# Patient Record
Sex: Male | Born: 1960 | Race: White | Hispanic: No | Marital: Single
Health system: Southern US, Community
[De-identification: ages and names within clinical notes are randomized; demographics above are authoritative.]

---

## 2007-10-10 ENCOUNTER — Ambulatory Visit: Payer: Self-pay | Admitting: General Surgery

## 2009-09-16 ENCOUNTER — Ambulatory Visit: Payer: Self-pay | Admitting: Internal Medicine

## 2009-12-10 ENCOUNTER — Ambulatory Visit: Payer: Self-pay | Admitting: Unknown Physician Specialty

## 2011-06-06 ENCOUNTER — Ambulatory Visit: Payer: Self-pay | Admitting: Gastroenterology

## 2011-06-07 LAB — PATHOLOGY REPORT

## 2012-09-27 ENCOUNTER — Inpatient Hospital Stay: Payer: Self-pay | Admitting: Surgery

## 2012-09-27 LAB — CBC
HCT: 39.6 % — ABNORMAL LOW (ref 40.0–52.0)
HGB: 13.7 g/dL (ref 13.0–18.0)
MCH: 33.4 pg (ref 26.0–34.0)
MCHC: 34.5 g/dL (ref 32.0–36.0)
Platelet: 214 10*3/uL (ref 150–440)
WBC: 17.9 10*3/uL — ABNORMAL HIGH (ref 3.8–10.6)

## 2012-09-27 LAB — BASIC METABOLIC PANEL
BUN: 19 mg/dL — ABNORMAL HIGH (ref 7–18)
Calcium, Total: 8.7 mg/dL (ref 8.5–10.1)
Chloride: 101 mmol/L (ref 98–107)
Co2: 26 mmol/L (ref 21–32)
Creatinine: 1.16 mg/dL (ref 0.60–1.30)
EGFR (African American): >60
EGFR (Non-African Amer.): >60
Potassium: 4 mmol/L (ref 3.5–5.1)
Sodium: 134 mmol/L — ABNORMAL LOW (ref 136–145)

## 2012-09-28 LAB — CBC WITH DIFFERENTIAL/PLATELET
Basophil %: 0.4 %
Eosinophil #: 0.6 10*3/uL (ref 0.0–0.7)
Eosinophil %: 3.4 %
HCT: 36 % — ABNORMAL LOW (ref 40.0–52.0)
HGB: 12.3 g/dL — ABNORMAL LOW (ref 13.0–18.0)
Lymphocyte %: 11.3 %
MCHC: 34.1 g/dL (ref 32.0–36.0)
MCV: 98 fL (ref 80–100)
Monocyte #: 1.1 x10 3/mm — ABNORMAL HIGH (ref 0.2–1.0)
Monocyte %: 6.2 %
RDW: 14.4 % (ref 11.5–14.5)
WBC: 17.1 10*3/uL — ABNORMAL HIGH (ref 3.8–10.6)

## 2012-09-28 LAB — COMPREHENSIVE METABOLIC PANEL
Albumin: 3.1 g/dL — ABNORMAL LOW (ref 3.4–5.0)
Alkaline Phosphatase: 99 U/L (ref 50–136)
Calcium, Total: 8.7 mg/dL (ref 8.5–10.1)
Co2: 28 mmol/L (ref 21–32)
Creatinine: 1.09 mg/dL (ref 0.60–1.30)
EGFR (African American): >60
EGFR (Non-African Amer.): >60
Glucose: 101 mg/dL — ABNORMAL HIGH (ref 65–99)
Osmolality: 276 (ref 275–301)
SGOT(AST): 22 U/L (ref 15–37)
Sodium: 137 mmol/L (ref 136–145)

## 2012-09-29 LAB — CBC WITH DIFFERENTIAL/PLATELET
Eosinophil %: 5.8 %
Lymphocyte %: 12.7 %
MCV: 99 fL (ref 80–100)
Monocyte #: 1 x10 3/mm (ref 0.2–1.0)
Monocyte %: 7.4 %
Neutrophil %: 73.6 %
RDW: 14.6 % — ABNORMAL HIGH (ref 11.5–14.5)
WBC: 13.5 10*3/uL — ABNORMAL HIGH (ref 3.8–10.6)

## 2012-09-30 LAB — CBC WITH DIFFERENTIAL/PLATELET
Basophil #: 0.1 10*3/uL (ref 0.0–0.1)
Basophil %: 0.4 %
HCT: 33.9 % — ABNORMAL LOW (ref 40.0–52.0)
HGB: 11.8 g/dL — ABNORMAL LOW (ref 13.0–18.0)
Lymphocyte #: 1.9 10*3/uL (ref 1.0–3.6)
Lymphocyte %: 13.5 %
MCH: 33.9 pg (ref 26.0–34.0)
MCV: 97 fL (ref 80–100)
Monocyte #: 1.3 x10 3/mm — ABNORMAL HIGH (ref 0.2–1.0)
Monocyte %: 9.3 %
Neutrophil #: 10.2 10*3/uL — ABNORMAL HIGH (ref 1.4–6.5)
Neutrophil %: 73.5 %
Platelet: 211 10*3/uL (ref 150–440)
WBC: 13.8 10*3/uL — ABNORMAL HIGH (ref 3.8–10.6)

## 2012-09-30 LAB — PROTIME-INR
INR: 1.7
Prothrombin Time: 20.7 secs — ABNORMAL HIGH (ref 11.5–14.7)

## 2012-09-30 LAB — BASIC METABOLIC PANEL
BUN: 14 mg/dL (ref 7–18)
Glucose: 97 mg/dL (ref 65–99)
Potassium: 4.2 mmol/L (ref 3.5–5.1)

## 2012-10-03 LAB — CBC WITH DIFFERENTIAL/PLATELET
Basophil #: 0 10*3/uL (ref 0.0–0.1)
Eosinophil #: 0.8 10*3/uL — ABNORMAL HIGH (ref 0.0–0.7)
Eosinophil %: 9.5 %
HCT: 31.4 % — ABNORMAL LOW (ref 40.0–52.0)
HGB: 10.6 g/dL — ABNORMAL LOW (ref 13.0–18.0)
Lymphocyte #: 1.9 10*3/uL (ref 1.0–3.6)
Lymphocyte %: 23.2 %
MCHC: 33.7 g/dL (ref 32.0–36.0)
Monocyte #: 1 x10 3/mm (ref 0.2–1.0)
Monocyte %: 12.3 %
Neutrophil #: 4.5 10*3/uL (ref 1.4–6.5)
Neutrophil %: 54.4 %
WBC: 8.3 10*3/uL (ref 3.8–10.6)

## 2012-10-09 ENCOUNTER — Ambulatory Visit: Payer: Self-pay | Admitting: Surgery

## 2012-10-28 ENCOUNTER — Ambulatory Visit: Payer: Self-pay | Admitting: Surgery

## 2012-11-20 ENCOUNTER — Inpatient Hospital Stay: Payer: Self-pay | Admitting: Internal Medicine

## 2012-11-20 LAB — BASIC METABOLIC PANEL
Calcium, Total: 8.9 mg/dL (ref 8.5–10.1)
Chloride: 105 mmol/L (ref 98–107)
Co2: 27 mmol/L (ref 21–32)
Creatinine: 0.71 mg/dL (ref 0.60–1.30)
EGFR (African American): >60
Glucose: 102 mg/dL — ABNORMAL HIGH (ref 65–99)
Osmolality: 270 (ref 275–301)
Potassium: 3.9 mmol/L (ref 3.5–5.1)
Sodium: 136 mmol/L (ref 136–145)

## 2012-11-20 LAB — CBC
HGB: 15.5 g/dL (ref 13.0–18.0)
MCH: 32 pg (ref 26.0–34.0)
RBC: 4.84 10*6/uL (ref 4.40–5.90)

## 2012-11-20 LAB — PROTIME-INR
INR: 1.1
Prothrombin Time: 14.4 secs (ref 11.5–14.7)

## 2012-11-20 LAB — TROPONIN I: Troponin-I: 0.07 ng/mL — ABNORMAL HIGH

## 2012-11-21 LAB — CBC WITH DIFFERENTIAL/PLATELET
Basophil #: 0 10*3/uL (ref 0.0–0.1)
Basophil %: 0.2 %
Eosinophil #: 0.4 10*3/uL (ref 0.0–0.7)
Eosinophil %: 3.2 %
HCT: 41.5 % (ref 40.0–52.0)
HGB: 14.3 g/dL (ref 13.0–18.0)
Lymphocyte #: 1.3 10*3/uL (ref 1.0–3.6)
Lymphocyte %: 11.8 %
MCHC: 34.4 g/dL (ref 32.0–36.0)
Monocyte %: 8.3 %
RBC: 4.29 10*6/uL — ABNORMAL LOW (ref 4.40–5.90)
RDW: 14.5 % (ref 11.5–14.5)

## 2012-11-21 LAB — BASIC METABOLIC PANEL
Calcium, Total: 8.4 mg/dL — ABNORMAL LOW (ref 8.5–10.1)
Chloride: 104 mmol/L (ref 98–107)
Co2: 30 mmol/L (ref 21–32)
EGFR (African American): >60
EGFR (Non-African Amer.): >60
Osmolality: 273 (ref 275–301)
Potassium: 4.2 mmol/L (ref 3.5–5.1)
Sodium: 137 mmol/L (ref 136–145)

## 2012-11-21 LAB — APTT
Activated PTT: 66.4 secs — ABNORMAL HIGH (ref 23.6–35.9)
Activated PTT: 65.5 secs — ABNORMAL HIGH (ref 23.6–35.9)
Activated PTT: 82.6 secs — ABNORMAL HIGH (ref 23.6–35.9)

## 2012-11-22 LAB — BASIC METABOLIC PANEL
Chloride: 104 mmol/L (ref 98–107)
Co2: 30 mmol/L (ref 21–32)
EGFR (Non-African Amer.): >60
Osmolality: 272 (ref 275–301)
Potassium: 4 mmol/L (ref 3.5–5.1)
Sodium: 136 mmol/L (ref 136–145)

## 2012-11-22 LAB — CBC WITH DIFFERENTIAL/PLATELET
Basophil #: 0.1 10*3/uL (ref 0.0–0.1)
Basophil %: 0.6 %
HCT: 37.7 % — ABNORMAL LOW (ref 40.0–52.0)
MCH: 32.2 pg (ref 26.0–34.0)
MCV: 96 fL (ref 80–100)
Monocyte #: 1 x10 3/mm (ref 0.2–1.0)
Monocyte %: 8.8 %
Platelet: 192 10*3/uL (ref 150–440)
RBC: 3.91 10*6/uL — ABNORMAL LOW (ref 4.40–5.90)
RDW: 14 % (ref 11.5–14.5)
WBC: 11.3 10*3/uL — ABNORMAL HIGH (ref 3.8–10.6)

## 2012-11-23 LAB — APTT: Activated PTT: 107.1 secs — ABNORMAL HIGH (ref 23.6–35.9)

## 2012-11-23 LAB — BASIC METABOLIC PANEL
BUN: 10 mg/dL (ref 7–18)
Chloride: 106 mmol/L (ref 98–107)
Co2: 30 mmol/L (ref 21–32)
Creatinine: 0.73 mg/dL (ref 0.60–1.30)
EGFR (African American): >60
EGFR (Non-African Amer.): >60
Osmolality: 276 (ref 275–301)
Potassium: 4.2 mmol/L (ref 3.5–5.1)

## 2012-11-23 LAB — CBC WITH DIFFERENTIAL/PLATELET
Basophil #: 0.1 10*3/uL (ref 0.0–0.1)
Basophil %: 0.5 %
Eosinophil #: 0.3 10*3/uL (ref 0.0–0.7)
HCT: 39.8 % — ABNORMAL LOW (ref 40.0–52.0)
HGB: 13.3 g/dL (ref 13.0–18.0)
Lymphocyte %: 12.2 %
MCHC: 33.3 g/dL (ref 32.0–36.0)
Monocyte %: 9.1 %
Neutrophil %: 75.8 %
WBC: 11.2 10*3/uL — ABNORMAL HIGH (ref 3.8–10.6)

## 2012-11-23 LAB — PROTIME-INR: INR: 1.6

## 2012-11-24 LAB — CBC WITH DIFFERENTIAL/PLATELET
Basophil #: 0.1 10*3/uL (ref 0.0–0.1)
Basophil %: 0.8 %
Eosinophil #: 0.4 10*3/uL (ref 0.0–0.7)
HCT: 38.8 % — ABNORMAL LOW (ref 40.0–52.0)
HGB: 12.7 g/dL — ABNORMAL LOW (ref 13.0–18.0)
Lymphocyte #: 2.1 10*3/uL (ref 1.0–3.6)
Lymphocyte %: 23.4 %
MCHC: 32.7 g/dL (ref 32.0–36.0)
MCV: 97 fL (ref 80–100)
Monocyte #: 0.9 x10 3/mm (ref 0.2–1.0)
Monocyte %: 9.9 %
Neutrophil %: 61.6 %
Platelet: 210 10*3/uL (ref 150–440)
RBC: 4.01 10*6/uL — ABNORMAL LOW (ref 4.40–5.90)
RDW: 14.4 % (ref 11.5–14.5)
WBC: 9 10*3/uL (ref 3.8–10.6)

## 2012-11-24 LAB — BASIC METABOLIC PANEL
Anion Gap: 3 — ABNORMAL LOW (ref 7–16)
BUN: 11 mg/dL (ref 7–18)
Chloride: 105 mmol/L (ref 98–107)
Co2: 30 mmol/L (ref 21–32)
EGFR (African American): >60
EGFR (Non-African Amer.): >60
Glucose: 90 mg/dL (ref 65–99)
Potassium: 4.2 mmol/L (ref 3.5–5.1)
Sodium: 138 mmol/L (ref 136–145)

## 2012-11-24 LAB — APTT
Activated PTT: >160 secs (ref 23.6–35.9)
Activated PTT: 87.5 secs — ABNORMAL HIGH (ref 23.6–35.9)

## 2012-11-24 LAB — PROTIME-INR: INR: 1.7

## 2013-02-17 ENCOUNTER — Ambulatory Visit: Payer: Self-pay | Admitting: Vascular Surgery

## 2013-02-17 LAB — PROTIME-INR
INR: 2.3
Prothrombin Time: 24.4 secs — ABNORMAL HIGH (ref 11.5–14.7)

## 2013-06-01 IMAGING — CR DG CHEST 2V
1 series · 2 of 2 positions shown · non-contrast
Comparison: none

REASON FOR EXAM: follow up
COMMENTS:

[Series 1: pa · 0.17mm/px · 2 of 2 slices shown]
[im 1/2]
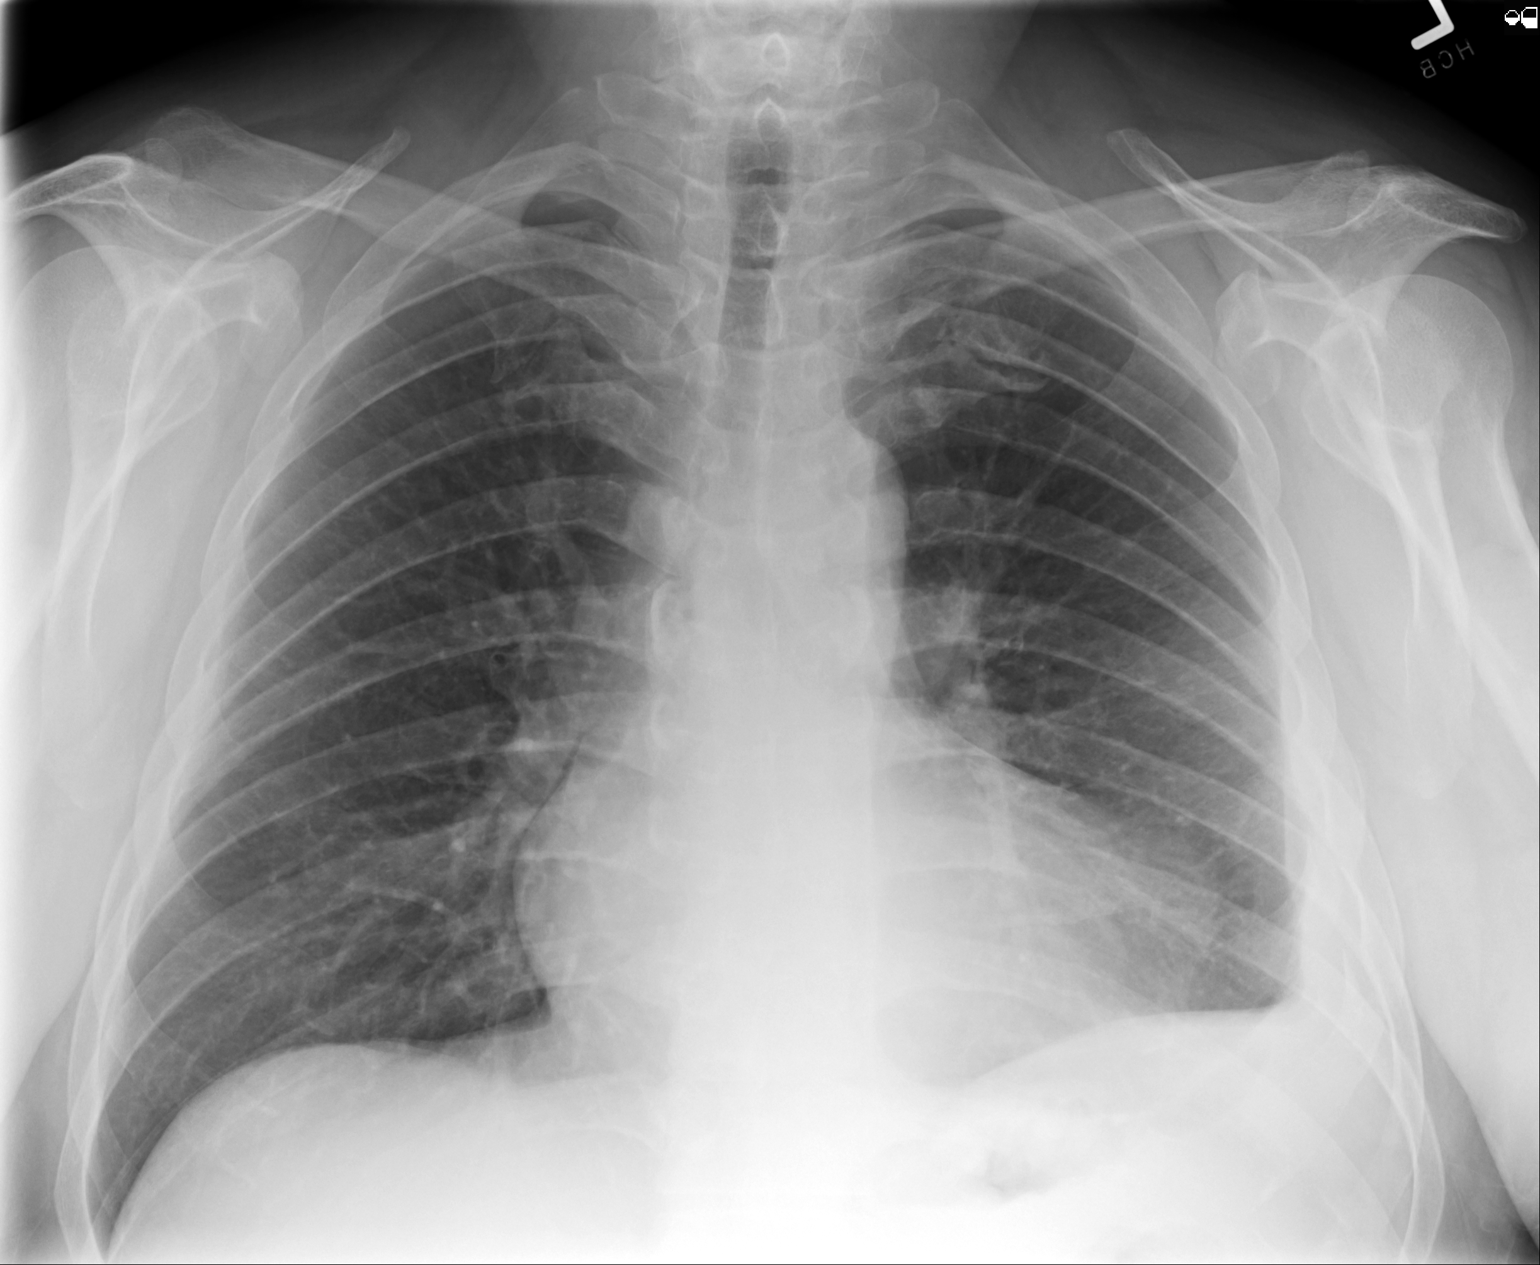
[im 2/2]
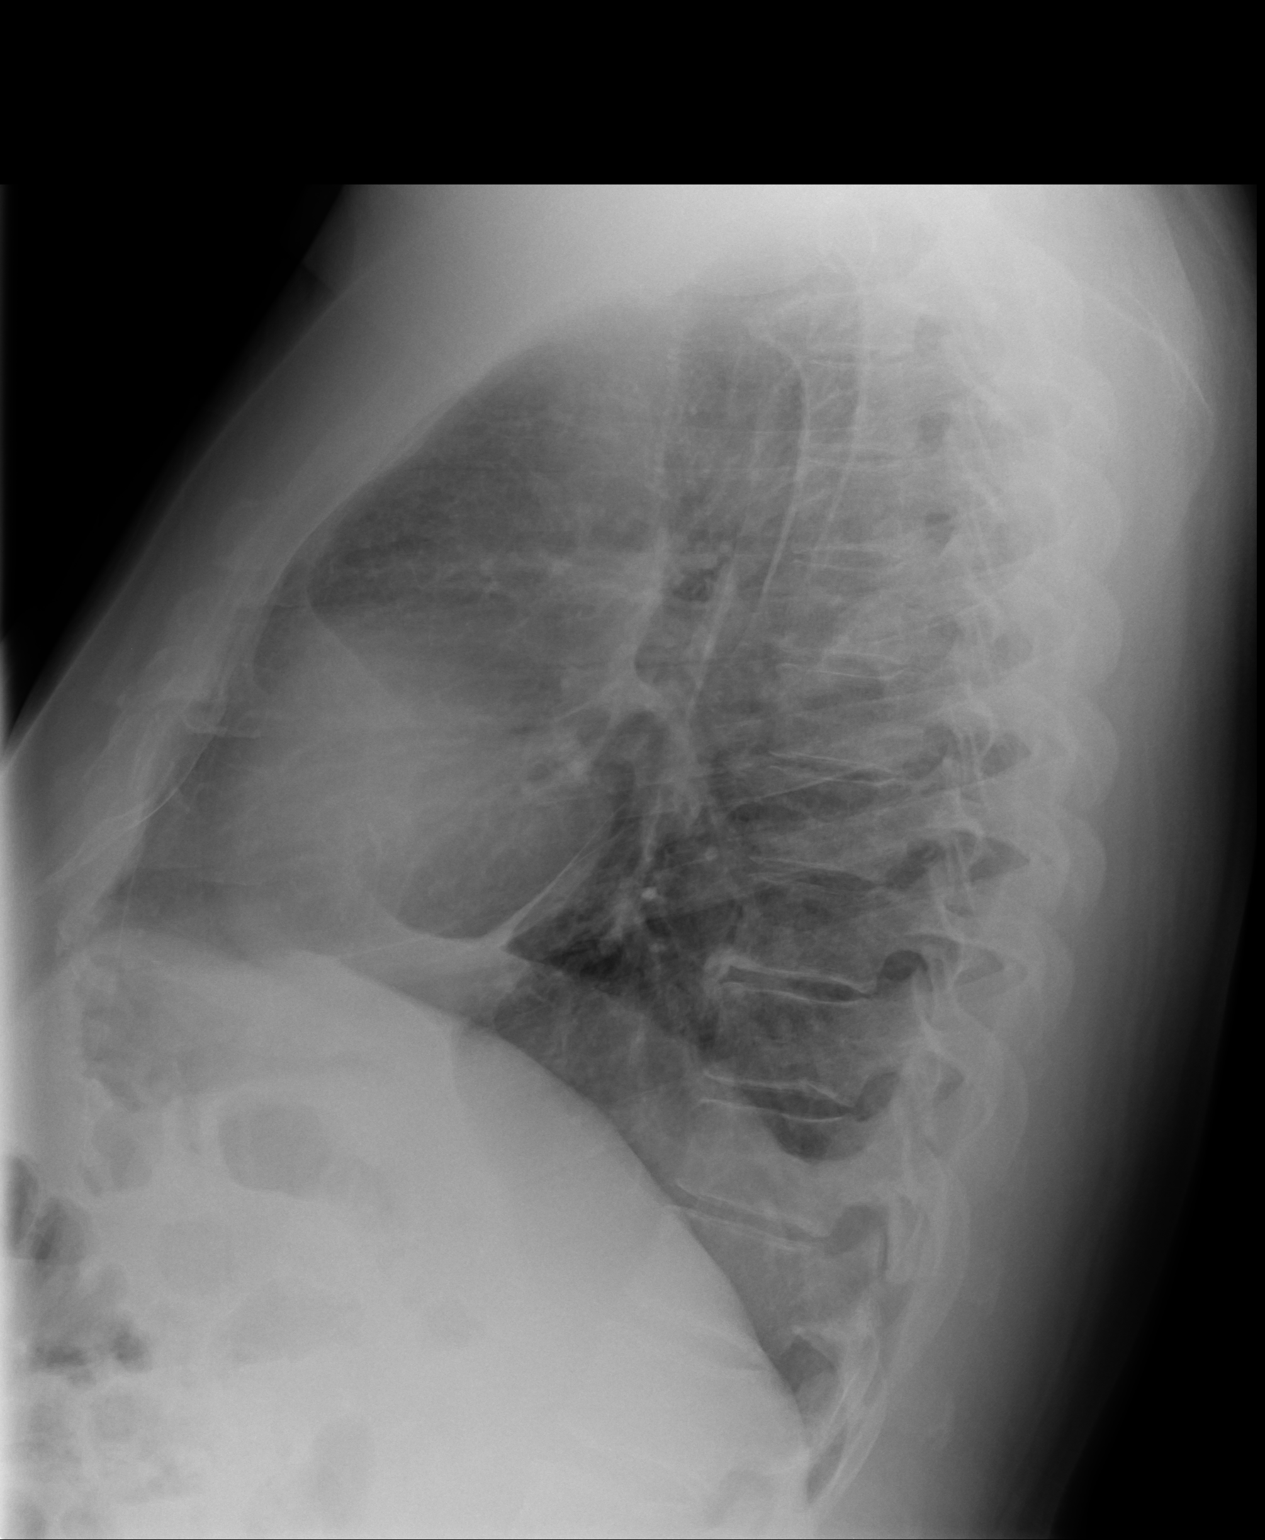

[2 of 2 positions shown; findings below may reference images not displayed]

PROCEDURE:     MDR - MDR CHEST PA(OR AP) AND LATERAL  - October 28, 2012  [DATE]

RESULT:     Comparison is made to the previous exam of 09 October, 2012.

There is persistent left pleural effusion with pleural thickening or
loculated fluid laterally in the inferior half of the left hemithorax. The
right lung is fully inflated and clear. The heart appears normal in size.
There is no pneumothorax. The patient has known left rib fractures.
IMPRESSION: Residual pleural thickening or loculated fluid along the
left chest wall at the site of fractures with a small amount of left pleural
fluid. No definite pneumothorax evident.

[REDACTED]

## 2013-09-21 IMAGING — XA IR VASCULAR PROCEDURE
2 series · 3 of 3 positions shown · IV contrast (IODINE)
Comparison: none

[Series 1: ivc · 2 of 2 slices shown]
[im 1/2]
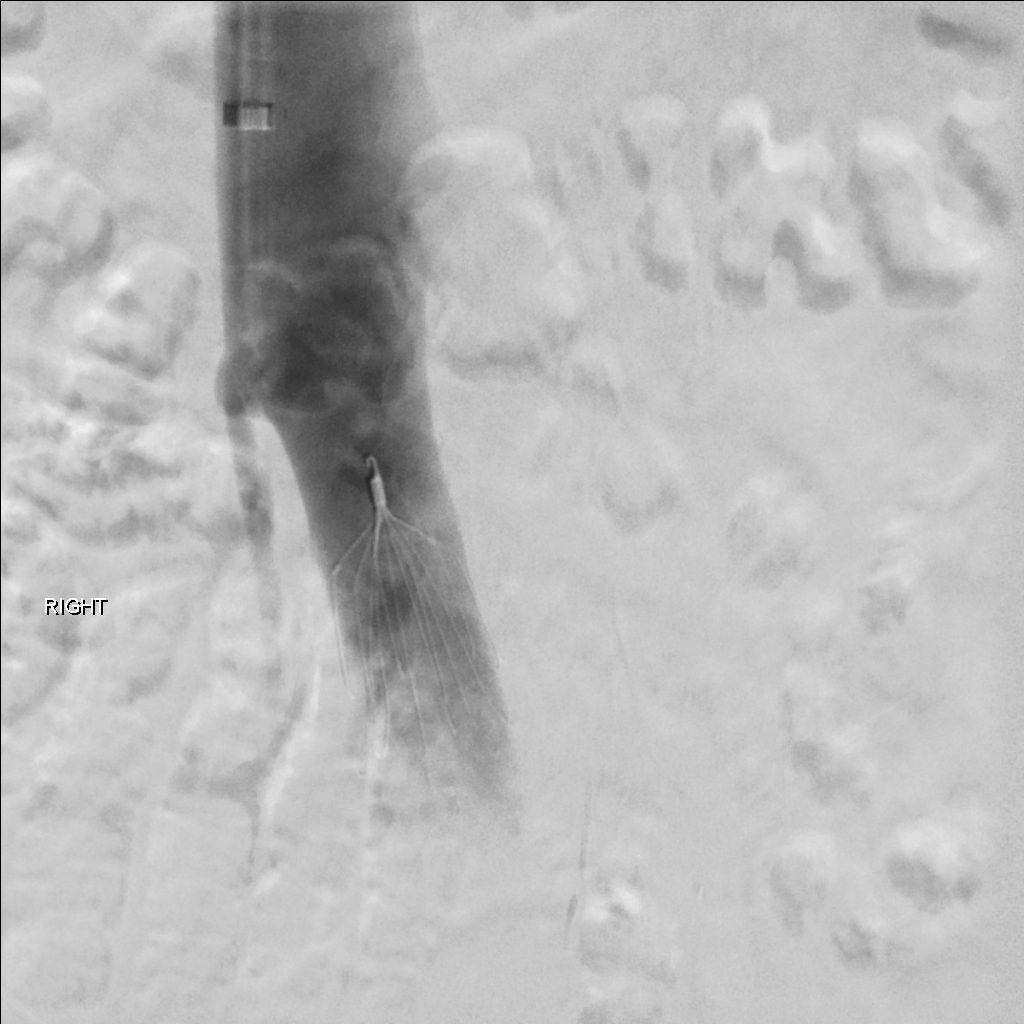
[im 2/2]
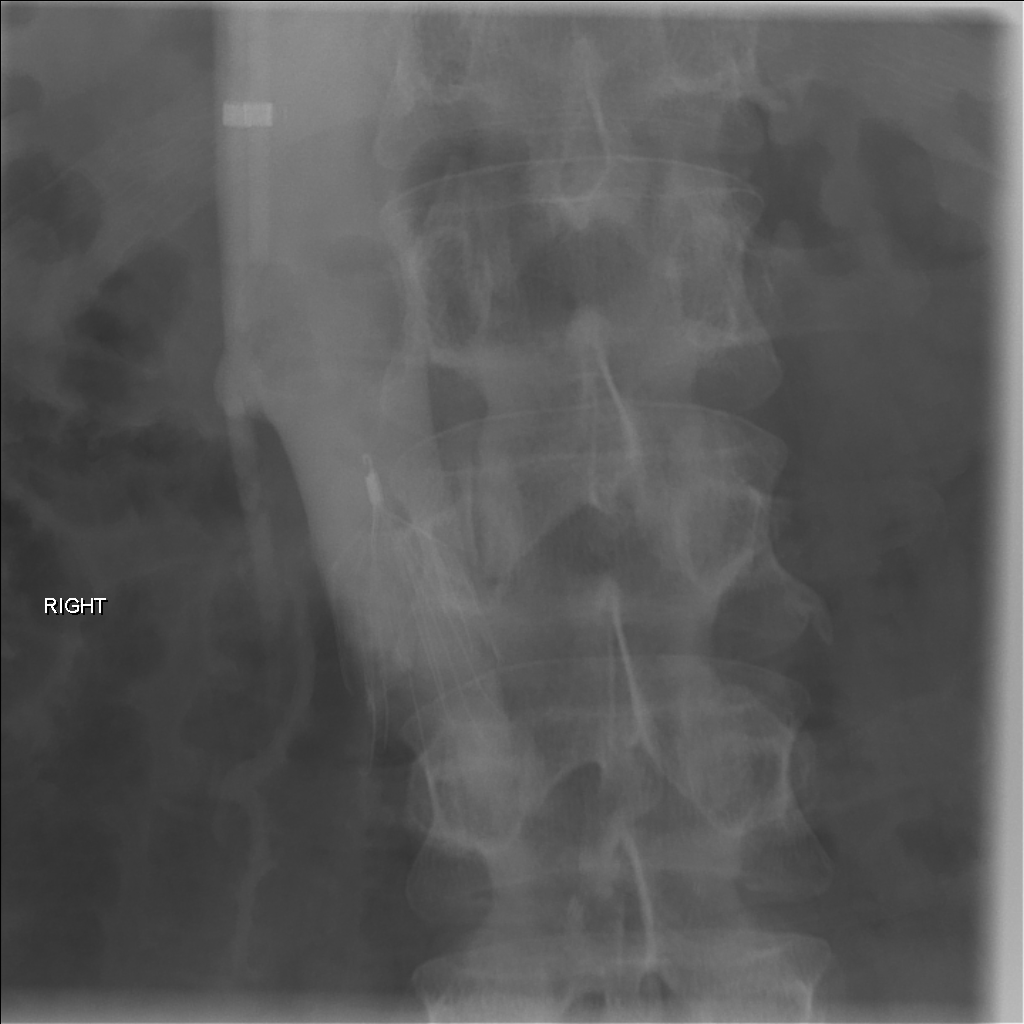

[Series 2: single · 1 of 1 slices shown]
[im 1/1]
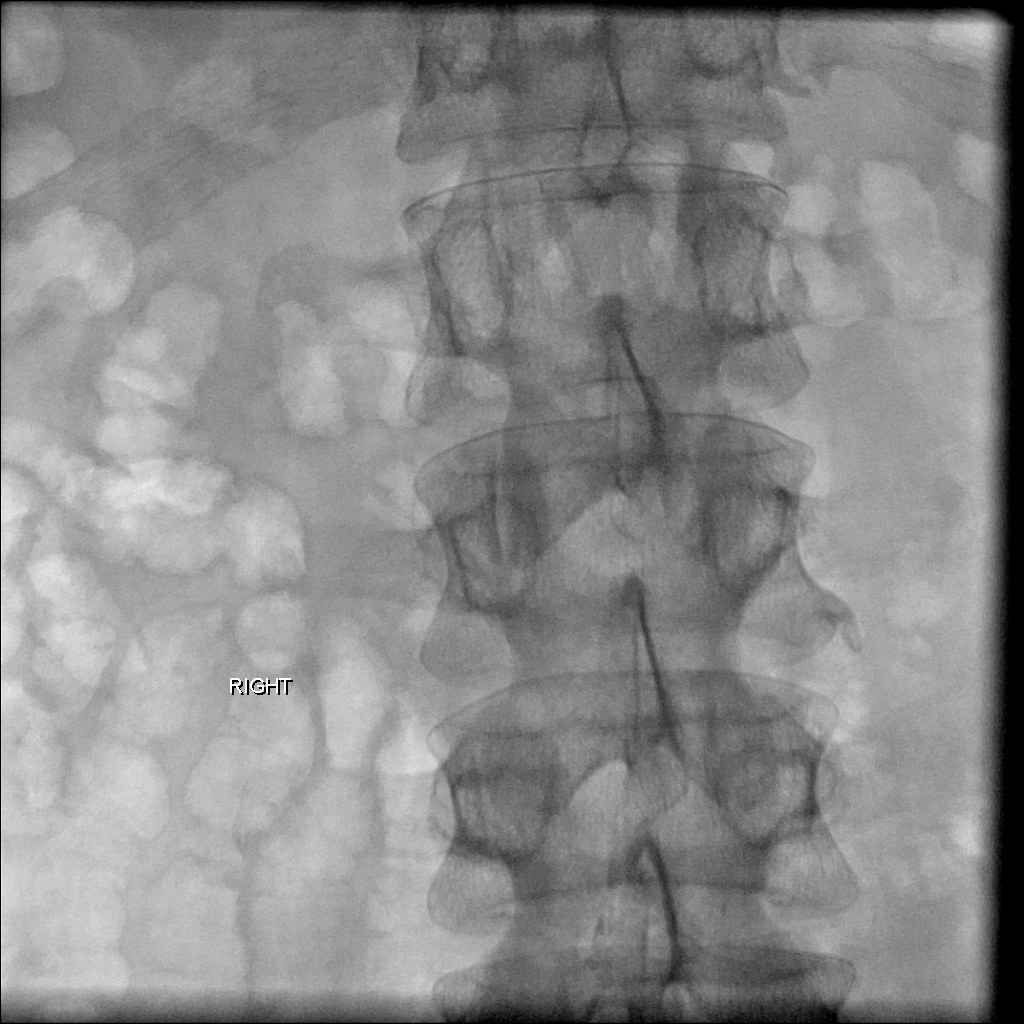

[3 of 3 positions shown; findings below may reference images not displayed]

IMAGES IMPORTED FROM THE [employer] WORKFLOW SYSTEM
NO DICTATION FOR STUDY

## 2015-01-01 NOTE — Discharge Summary (Signed)
PATIENT NAME:  Edward Casey, Edward Casey MR#:  [id number] DATE OF BIRTH:  10-25-1960  DATE OF ADMISSION:  09/27/2012 DATE OF DISCHARGE:  10/04/2012  DISCHARGE DIAGNOSES: 1.  Left traumatic hemopneumothorax.  2.  History of hernia repair.   DISCHARGE MEDICATIONS: Zyrtec 10 mg p.o. daily; Celexa 20 mg p.o. daily; Acetaminophen oxycodone 325/5 1 to 2 tabs p.o. q.4 hours p.r.n. pain.   INDICATION FOR ADMISSION:  Edward Casey is a pleasant 54 year old male who had a fall and had a traumatic hemopneumothorax and placed a chest tube on date of admission.   HOSPITAL COURSE: Edward Casey had a tube thoracostomy placed.  It drained about 1.5 liter of fluids. He had a large air leak initially.  During his hospital course, his tube was advanced to a water seal and later discontinued. At the time of discharge, his lung was expanded and there was minimal residual fluid.  He was having a regular diet and pain was controlled with p.o. pain medications.   DISCHARGE INSTRUCTIONS: Edward Casey is to follow up in approximately 1 to 2 weeks with myself. He is to call or return to the ED if he has increased pain, nausea, vomiting, redness, or drainage from incision or shortness of breath.    ____________________________ Edward Raiderhristopher A. Yarelie Hams, MD cal:eg D: 10/12/2012 17:49:00 ET T: 10/13/2012 20:38:26 ET JOB#: [id number]  cc: Cristal Deerhristopher A. Izreal Kock, MD, <Dictator> Jarvis NewcomerHRISTOPHER A Jianni Shelden MD ELECTRONICALLY SIGNED 10/20/2012 9:52

## 2015-01-01 NOTE — H&P (Signed)
PATIENT NAME:  Edward Casey, Edward Casey MR#:  [id number] DATE OF BIRTH:  1961/02/25  DATE OF ADMISSION:  09/27/2012  REASON FOR ADMISSION:  Chest wall pain and shortness of breath after falling on computer desk 4 days ago.  He was noted to have a large hemothorax on the left side and fractured ribs.   HISTORY OF PRESENT ILLNESS:  The patient is a pleasant 54 year old male who fell and hit his left chest on his desk approximately 4 days ago.  He was seen in urgent care and was sent home with pain control.  He was noted to have persistent pain despite PO narcotics and therefore presented to ED today.  A CT was performed which showed a large hemothorax on his left side and he was having difficulty breathing, otherwise was having significant pain which was not controlled.  No fevers, chills, night sweats, shortness of breath, cough, abdominal pain, nausea, vomiting, diarrhea, constipation.   PAST MEDICAL HISTORY:  History of hernia repair.   MEDICATIONS:  Zyrtec 10 mg daily, Celexa 20 mg p.o. daily.   ALLERGIES:  No known drug allergies.   SOCIAL HISTORY:  [place] in [place].  There is no family here with him.  Current every day smoker and social drinker.  Does not have withdrawals.  Says he smokes about a pack a day.   REVIEW OF SYSTEMS:  A 12-point review of systems was obtained.  Pertinent positives and negatives as above.   PHYSICAL EXAMINATION: VITAL SIGNS:  Current vitals, temperature 99.4, pulse 102, blood pressure 139/76, respirations 16, 96% on room air.    GENERAL:  No acute distress.  Alert and oriented x 3.  HEAD:  Normocephalic, atraumatic. EYES:  No scleral icterus.  No conjunctivitis.  FACE:  No obvious facial trauma.  NECK:  Supple.  No obvious masses.  CHEST:  Decreased breath sounds diffusely on the left side.  No tracheal deviation.  HEART:  Regular rate and rhythm.  No murmurs, rubs or gallops.  ABDOMEN:  Soft, nontender, nondistended.  No obvious bruising.  EXTREMITIES:  Moves  all extremities well, 5/5 strength.  NEUROLOGIC:  Cranial nerves II-XII grossly intact.  Sensation intact.   LABORATORY DATA:  I personally reviewed all labs, significant for white cell count of 17.9, hemoglobin 13.7, hematocrit 39.6, platelets are 214.  Renal panel is significant for a BUN of 19 and creatinine of 1.16.   CT scan shows a moderate to large hemothorax with some collapse as well as a small pneumothorax on that side.   ASSESSMENT AND PLAN:  The patient is a pleasant 54 year old with a large traumatic hemothorax.  I have placed a chest tube.  See dictation of chest tube.  He will be admitted for pain control and chest tube placed on suction.  We will obtain an x-ray tomorrow to look for evacuation of a hemothorax and if lung stays expanded potentially re-evaluating with a CT if there still appears to be an effusion in a few days and potentially asking Dr. Thelma Bargeaks to evaluate the patient if unable to clear out blood entirely.    ____________________________ Si Raiderhristopher A. Dazia Lippold, MD cal:ea D: 09/27/2012 22:09:59 ET T: 09/28/2012 04:02:14 ET JOB#: [id number]  cc: Cristal Deerhristopher A. Dillan Lunden, MD, <Dictator> Jarvis NewcomerHRISTOPHER A Melaysia Streed MD ELECTRONICALLY SIGNED 10/01/2012 9:08

## 2015-01-01 NOTE — Consult Note (Signed)
PATIENT NAME:  Edward Casey, Edward Casey MR#:  [id number] DATE OF BIRTH:  1961/08/29  DATE OF CONSULTATION:  09/30/2012  REFERRING PHYSICIAN:  Ida Roguehristopher Lundquist, MD CONSULTING PHYSICIAN:  Sheppard Plumberimothy E. Thelma Bargeaks, MD  REASON FOR CONSULTATION: Management of hydropneumothorax.   I have personally seen and examined Edward Casey. I have independently reviewed his films. I have discussed his care with Dr. Juliann PulseLundquist.   HISTORY OF PRESENT ILLNESS: Mr. Edward Casey is a 54 year old gentleman who approximately one week ago stood up from his computer workstation, experienced acute onset of lightheadedness or weakness in his leg, and fell. He thinks that this was probably because his leg fell asleep while he was sitting at the computer station for a prolonged period of time. When he fell, he injured his left flank. He did not loose consciousness. He did not vomit. He presented to an urgent care center the next day where he was given some Percocet for pain control. He represented back to the doctor's office several days later whenever he experienced increasing shortness of breath and pain. A chest x-ray and CT scan were performed which revealed the presence of two left-sided rib fractures as well as a large hydropneumothorax. A chest tube was inserted and drained approximately 2 liters of blood over the first several hours. Since then the chest tube has remained in good position. A chest x-ray ha shown that there has been a marked reduction in the pleural effusion. He did have a chest x-ray made today and a CT scan. The CT scan confirmed the presence of the tube in good position posteriorly. There is no pleural effusion on the left. There is a pneumothorax present. There is extensive interstitial process, in the right lung, consistent with either aspiration or contusion.   PHYSICAL EXAMINATION: He is an obese gentleman in no distress. Exam of his skin reveals no bruising. There is no hematoma. There is a chest tube present, in the left  chest. It is draining serous to serosanguineous fluid. There is a small air leak present when hooked to suction. His lungs are diminished throughout. His heart is regular. His abdomen is obese, soft and nontender.   ASSESSMENT AND PLAN: I have independently reviewed the patient's CT scan. There is an extensive pulmonic infiltrate, in the right side, consistent with either aspiration for contusion. Given the mechanism of injury I suspect contusion is less likely.   Because there is an air leak I would recommend we leave the chest tube on suction. We will obtain daily x-rays. I have encouraged him to use whatever means available to him for pain control and also encouraged him to use his incentive spirometer. We will repeat his chest x-ray tomorrow.   Thank you very much for allowing me to participate in his care today. ____________________________ Sheppard Plumberimothy E. Thelma Bargeaks, MD teo:sb D: 09/30/2012 13:55:44 ET T: 09/30/2012 14:11:16 ET JOB#: [id number]  cc: Marcial Pacasimothy E. Thelma Bargeaks, MD, <Dictator> Jasmine DecemberIMOTHY E Aaron Boeh MD ELECTRONICALLY SIGNED 10/01/2012 9:52

## 2015-01-01 NOTE — Op Note (Signed)
PATIENT NAME:  Edward HewsJAMES, Migel MR#:  [id number] DATE OF BIRTH:  03/11/1961  DATE OF PROCEDURE:  09/27/2012  PREOPERATIVE DIAGNOSIS: Left traumatic hemothorax and rib fractures.   POSTOPERATIVE DIAGNOSIS: Left traumatic hemothorax and rib fractures.  PROCEDURE PERFORMED: Left tube thoracostomy.   SURGEON: Kristalyn Bergstresser A. Raine Elsass, M.D.   ANESTHESIA: 30 mL 1% lidocaine, 2 mg Versed, 100 mg fentanyl.   SPECIMENS: None.  ESTIMATED BLOOD LOSS: Approximately 1.5 liters initially from left chest.   INDICATION FOR PROCEDURE: The patient is a pleasant 54 year old male who presented with a moderate size left hemothorax. He was having difficulty breathing and was deemed a suitable candidate for a tube thoracostomy.   DETAILS OF THE PROCEDURE: Informed consent was obtained. The patient was given Versed and fentanyl. His left chest was then prepped and draped in standard surgical fashion. A time-out was then performed correctly identifying the patient name, operative site, and procedure to be performed. I then infiltrated the area of concern with 1% lidocaine with epinephrine. I then made an incision above his rib at the anterior axillary line at approximately his nipple. I then went down to the rib. The rib was infiltrated. The pleura was then popped into using a Kelly hemostat. There was a large amount of blood instantly after getting into the chest cavity. I then placed a tube to approximately 14 cm at which time I felt resistance. I then hooked the tube up to the atrium and there was about one more liter of blood which was released. The tube was then sutured in place. A sterile dressing was then placed over the wound. The postoperative x-ray       showed a tube in adequate position. There were no immediate complications. Needle, sponge, and instrument counts were correct at the end of the procedure.   ____________________________ Si Raiderhristopher A. Janyia Guion, MD cal:jm D: 09/27/2012 22:04:02  ET T: 09/28/2012 13:01:47 ET JOB#: [id number]  cc: Cristal Deerhristopher A. Elham Fini, MD, <Dictator> Jarvis NewcomerHRISTOPHER A Yarah Fuente MD ELECTRONICALLY SIGNED 10/01/2012 9:09

## 2015-01-01 NOTE — Op Note (Signed)
PATIENT NAME:  Edward Casey, Edward Casey MR#:  [id number] DATE OF BIRTH:  [birth year]  DATE OF PROCEDURE:  11/21/2012  PREOPERATIVE DIAGNOSES: 1.  Submassive pulmonary embolus.  2.  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  3.  Hypoxia.  4.  Recent chest trauma and hemothorax.   POSTOPERATIVE DIAGNOSES: 1.  Submassive pulmonary embolus.  2.  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  3.  Hypoxia.  4.  Recent chest trauma and hemothorax.  PROCEDURES PERFORMED: 1.  Ultrasound guidance for vascular access, right femoral vein.  2.  Catheter placement into inferior vena cava.  3.  Inferior venacavogram.  4.  Placement of a Bard Meridian IVC filter.   SURGEON:  Annice NeedyJason S. Dew, M.D.   ANESTHESIA:  Local with 4 mg of Versed.   ESTIMATED BLOOD LOSS:  Minimal.   FLUOROSCOPY TIME:  Less than 1 minute.   CONTRAST USED:  15 mL.   INDICATION FOR PROCEDURE:  A 54 year old male with multiple issues.  He has had a recent chest trauma, had a hemothorax and rib fractures.  He was admitted with worsening shortness of breath and found to have a submassive PE.  He has hypoxia on oxygen.  He has a significant residual left lower extremity deep vein thrombosis.  We are asked to place an IVC filter as further embolization would likely be fatal.  Risks and benefits were discussed.  Informed consent is obtained.   DESCRIPTION OF PROCEDURE:  The patient is brought to the vascular interventional radiology suite.  Groin is shaved, prepped and a sterile surgical field was created.  The right femoral vein is visualized with ultrasound, found to be widely patent.  It was then accessed under ultrasound guidance without difficulty with Seldinger needle.  J-wire was placed after skin nick and dilatation, the delivery sheath was placed into the inferior vena cava.  Inferior venacavogram was performed.  This showed a patent vena cava at the level of the renal veins at about the L1 to L2 interspace and the filter was  deployed at the top of L2.  The delivery sheath was then removed.  Pressure was held.  Sterile dressing was placed.  The patient tolerated the procedure well and was taken to the recovery room in stable condition.    ____________________________ Annice NeedyJason S. Dew, MD jsd:ea D: 11/21/2012 16:25:36 ET T: 11/21/2012 23:53:19 ET JOB#: [id number]  cc: Annice NeedyJason S. Dew, MD, <Dictator> Annice NeedyJASON S DEW MD ELECTRONICALLY SIGNED 11/25/2012 11:52
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
## 2015-01-01 NOTE — Discharge Summary (Signed)
PATIENT NAME:  Edward Casey, Edward Casey MR#:  [id number] DATE OF BIRTH:  09/23/1960  DATE OF ADMISSION:  11/20/2012 DATE OF DISCHARGE:  11/24/2012  DIAGNOSES AT TIME OF DISCHARGE: 1.  Acute respiratory failure secondary to pulmonary embolism.  2.  History of tobacco and alcohol use.  3.  Anxiety/depression.  4.  The patient is status post IVC filter placement.   CHIEF COMPLAINT: Right-sided upper chest pain and shortness of breath.   HISTORY OF PRESENT ILLNESS: The patient is a 54 year old gentleman with a history of depression, seasonal allergies, who was recently admitted after a fall and hit his chest and developed aright hemi-thorax hematoma. The patient also had a chest tube placed at that time and spent several days in the hospital. Was subsequently discharged, but reportedly had been cutting some limbs and later developed shortness of breath. The patient, when he came to the ER, was noted to have elevated troponin and D-dimer over 6, pCO2 of 31, pO2 52 and oxygen saturation at 90% on 4 liters nasal cannula.   PAST MEDICAL HISTORY: Significant for seasonal allergies, depression, obesity, tobacco use and colonic polyps.   PAST SURGICAL HISTORY: Significant for hernia repair.   PHYSICAL EXAMINATION: VITAL SIGNS: Blood pressure is 134/75, pulse 84, respirations 24, oxygen saturation 97% on 6 liters nasal cannula. PO2 the 50s, and an ABG.  GENERAL:  He was alert and oriented x 3, not in distress.  HEENT: Normocephalic, atraumatic.  HEART: S1, S2.  LUNGS: Decreased air entry in both lung fields.  ABDOMEN: Soft, nontender.  EXTREMITIES: Trace edema.  NEUROLOGIC: Nonfocal.  LABORATORY, DIAGNOSTIC AND RADIOLOGIC FINDINGS:  During his stay in the hospital, the patient underwent a CT scan, which showed extensive bilateral pulmonary emboli with small opacity of the left lower lobe, which could be related to previous chest tube. The patient was placed on IV heparin and also started on Coumadin.    Ultrasound of the leg showed occlusive thrombus in the left superficial femoral vein and distal popliteal vein. There was no sonographic evidence of deep vein thrombosis.   CONSULTATIONS: The patient was also seen by vascular surgery, Dr. Wyn Quakerew and he underwent  IVC filter placement, which he tolerated well.   CONDITION:  He was discharged in stable condition.   DISCHARGE MEDICATIONS: Celexa 40 mg at bedtime, Zyrtec 10 mg b.i.d., Viagra 50 mg once a day, Coumadin 5 mg at bedtime and  oxazepam 120 mg subcutaneous every 12 hours.    DISCHARGE INSTRUCTIONS:  The patient has been advised to have a PT/INR check on 11/26/2012 and follow-up with Dr. Sampson GoonFitzgerald. Instructions were also given regarding Lovenox injections. He has been advised to call the office if there are any questions or concerns. Further adjustment of Coumadin dosage will be based on PT/INR.   Total time spent in discharge of this patient: 35 minutes    ____________________________ Barbette ReichmannVishwanath Kaya Pottenger, MD vh:cc D: 11/24/2012 14:03:50 ET T: 11/24/2012 22:41:01 ET JOB#: [id number]  cc: Barbette ReichmannVishwanath Arshdeep Bolger, MD, <Dictator> Barbette ReichmannVISHWANATH Clary Boulais MD ELECTRONICALLY SIGNED 12/22/2012 13:37

## 2015-01-01 NOTE — Op Note (Signed)
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    PROCEDURE: 1.  Ultrasound guidance for vascular access, right jugular vein.  2.  Catheter placement into inferior vena cava.  3.  Inferior venacavogram.  4.  Retrieval of inferior vena cava filter.   SURGEON: Annice NeedyJason S. Axil Copeman, M.D.   ANESTHESIA: Local with moderate conscious sedation.   ESTIMATED BLOOD LOSS: Minimal.   FLUOROSCOPY TIME: Approximately 1 minute.   CONTRAST USED: 15 mL.   INDICATION FOR PROCEDURE: A 54 year old male, who had a DVT and sub-massive PE with hypoxia about 3 months ago. He had an IVC filter placed at that time. The patient returned in followup. His DVT has cleared. To avoid long-term complications of the filter being in place, a filter retrieval was discussed and he desired to have this removed.   DESCRIPTION OF PROCEDURE: The patient is brought to the vascular interventional radiology suite. The right neck and chest were sterilely prepped and draped and a sterile surgical field was created. The right jugular vein was visualized on ultrasound and found to be widely patent. It was then accessed under direct ultrasound guidance without difficulty with Seldinger needle and a permanent image was recorded. A J-wire was placed. After skin nick and dilatation, the retrieval sheath was placed into the inferior vena cava and an inferior venacavogram was performed. This demonstrated a patent vena cava. The filter was then hooked without difficulty with the snare on the hook at the top. The sheath was then advanced over the filter collapsing the filter. The filter was removed in its entirety. The retrieval sheath was then removed.  Pressure was held. Sterile dressing was placed. The patient tolerated the procedure well and was taken to the recovery room in stable condition.   ____________________________ Annice NeedyJason S. Laiyah Exline, MD jsd:aw D: 02/17/2013 11:00:09 ET T: 02/17/2013 12:17:23 ET JOB#: [id number]  cc: Annice NeedyJason S. Elizaveta Mattice, MD, <Dictator> Stann Mainlandavid P. Sampson GoonFitzgerald, MD Annice NeedyJASON S Nashali Ditmer MD ELECTRONICALLY SIGNED 02/19/2013 10:59

## 2015-01-01 NOTE — Consult Note (Signed)
Brief Consult Note: Diagnosis: traumatic left hemopneumothorax.   Patient was seen by consultant.   Consult note dictated.   Orders entered.   Comments: Leave on suction (small air leak today) Repeat chest xray in morning.  Electronic Signatures: Jasmine Decemberaks, Sheril Hammond E (MD)  (Signed 20-Jan-14 13:56)  Authored: Brief Consult Note   Last Updated: 20-Jan-14 13:56 by Jasmine Decemberaks, Kofi Murrell E (MD)
